# Patient Record
Sex: Male | Born: 1950 | Race: White | Hispanic: No | Marital: Married | Smoking: Never smoker
Health system: Southern US, Community
[De-identification: ages and names within clinical notes are randomized; demographics above are authoritative.]

## PROBLEM LIST (undated history)

## (undated) DIAGNOSIS — Z7689 Persons encountering health services in other specified circumstances: Secondary | ICD-10-CM

## (undated) DIAGNOSIS — Z9852 Vasectomy status: Secondary | ICD-10-CM

## (undated) DIAGNOSIS — I10 Essential (primary) hypertension: Secondary | ICD-10-CM

## (undated) DIAGNOSIS — K635 Polyp of colon: Secondary | ICD-10-CM

## (undated) DIAGNOSIS — E785 Hyperlipidemia, unspecified: Secondary | ICD-10-CM

## (undated) DIAGNOSIS — T7840XA Allergy, unspecified, initial encounter: Secondary | ICD-10-CM

## (undated) DIAGNOSIS — M549 Dorsalgia, unspecified: Secondary | ICD-10-CM

## (undated) DIAGNOSIS — I259 Chronic ischemic heart disease, unspecified: Secondary | ICD-10-CM

## (undated) DIAGNOSIS — Z9861 Coronary angioplasty status: Secondary | ICD-10-CM

## (undated) DIAGNOSIS — M6788 Other specified disorders of synovium and tendon, other site: Secondary | ICD-10-CM

## (undated) HISTORY — PX: CARDIAC CATHETERIZATION: SHX172

## (undated) HISTORY — PX: HERNIA REPAIR: SHX51

## (undated) HISTORY — PX: VASECTOMY: SHX75

## (undated) HISTORY — PX: CORONARY ANGIOPLASTY: SHX604

## (undated) HISTORY — PX: TONSILLECTOMY: SUR1361

---

## 2010-08-30 HISTORY — PX: CORONARY ARTERY BYPASS GRAFT: SHX141

## 2010-09-15 ENCOUNTER — Encounter: Payer: Self-pay | Admitting: Cardiovascular Disease

## 2013-01-18 ENCOUNTER — Emergency Department: Payer: Self-pay | Admitting: Emergency Medicine

## 2013-02-23 ENCOUNTER — Ambulatory Visit: Payer: Self-pay | Admitting: Family Medicine

## 2014-10-15 IMAGING — CT CT HEAD WITHOUT CONTRAST
1 series · 16 of 30 positions shown, 20 images · non-contrast
Comparison: none

REASON FOR EXAM: trauma to head on plavix
COMMENTS:

[Series 2: soft tissue · axial · 0.42mm/px · z∈[-152,-18]mm · 16 of 31 slices shown, 20 images]
[im 2/31  brain]
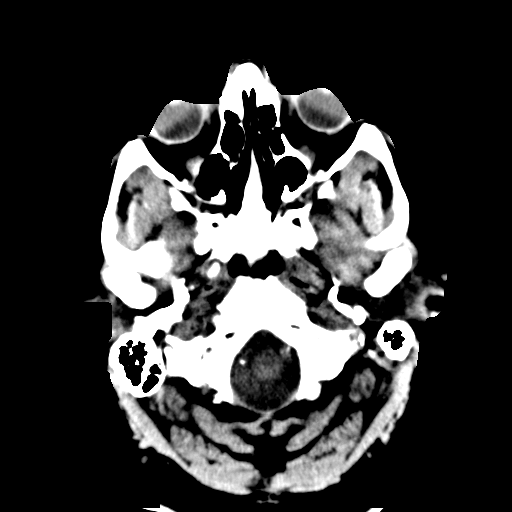
[im 2/31  bone]
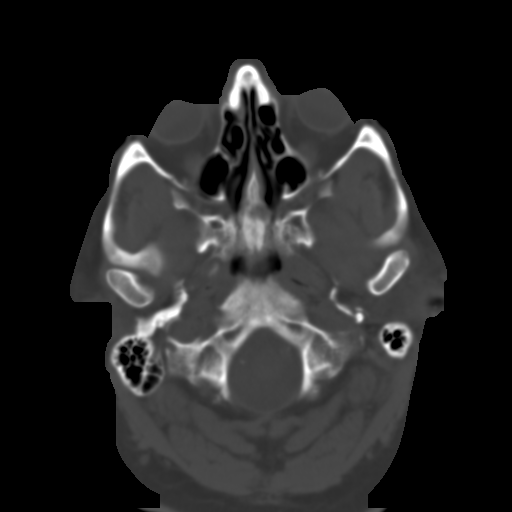
[im 4/31  brain]
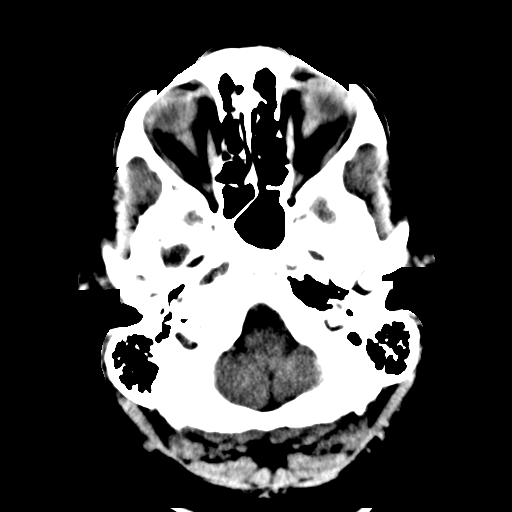
[im 6/31  brain]
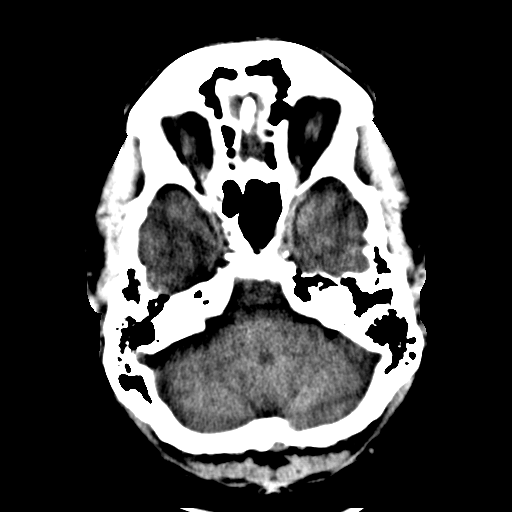
[im 8/31  brain]
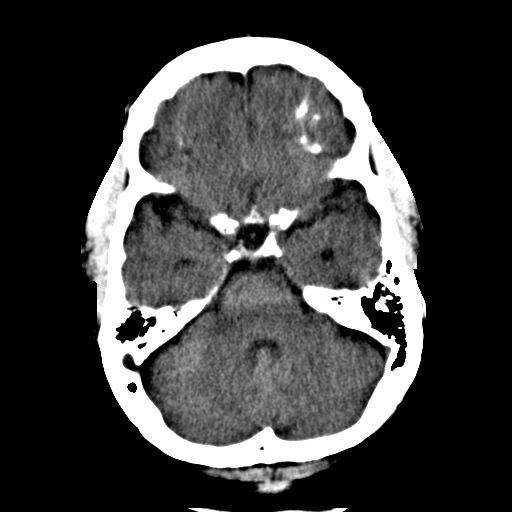
[im 9/31  brain]
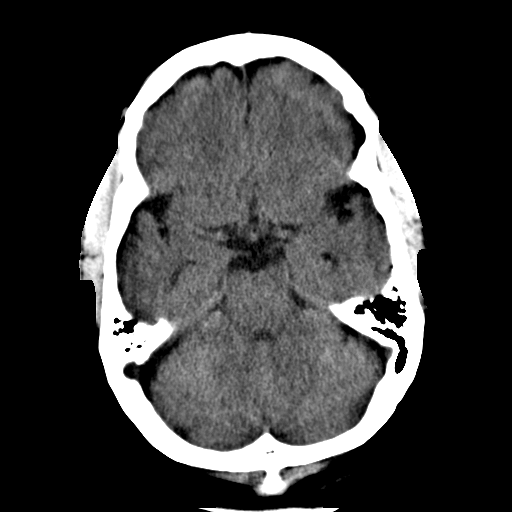
[im 9/31  bone]
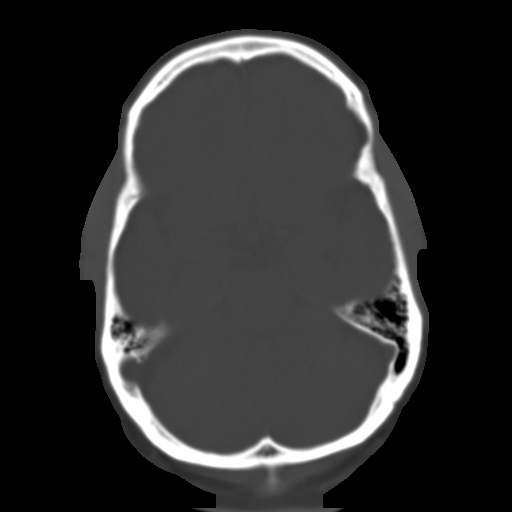
[im 11/31  brain]
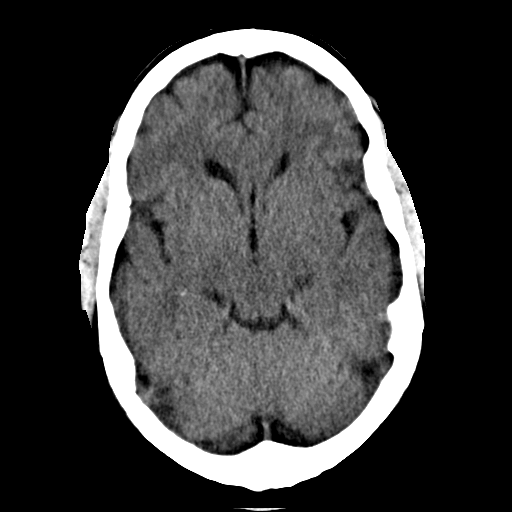
[im 13/31  brain]
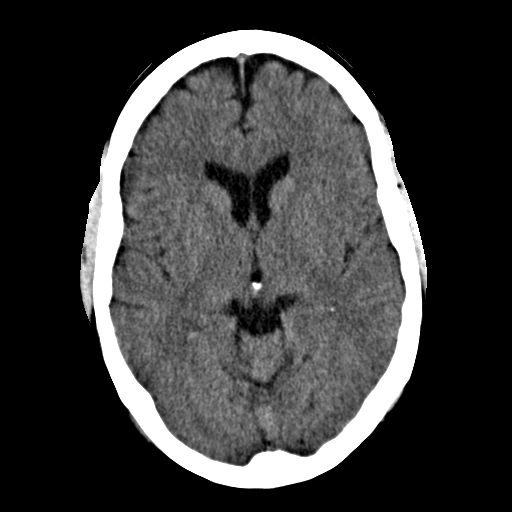
[im 15/31  brain]
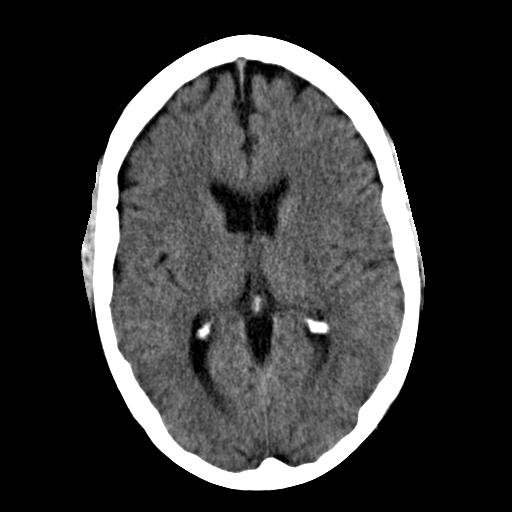
[im 16/31  brain]
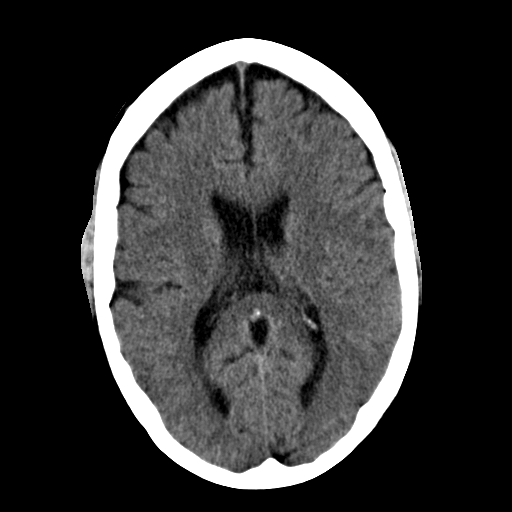
[im 16/31  bone]
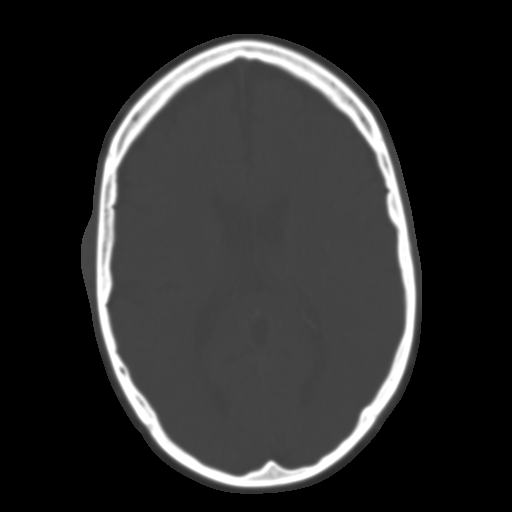
[im 18/31  brain]
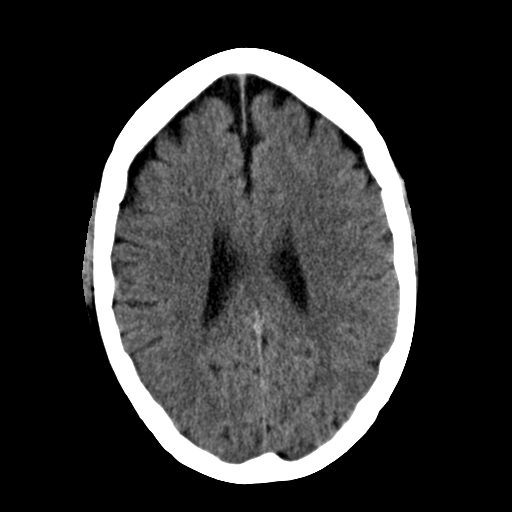
[im 20/31  brain]
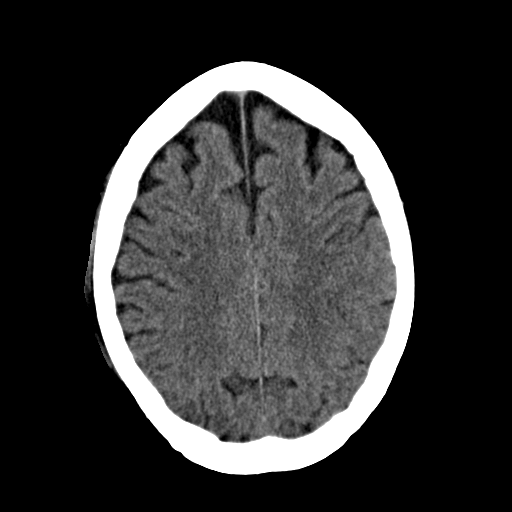
[im 22/31  brain]
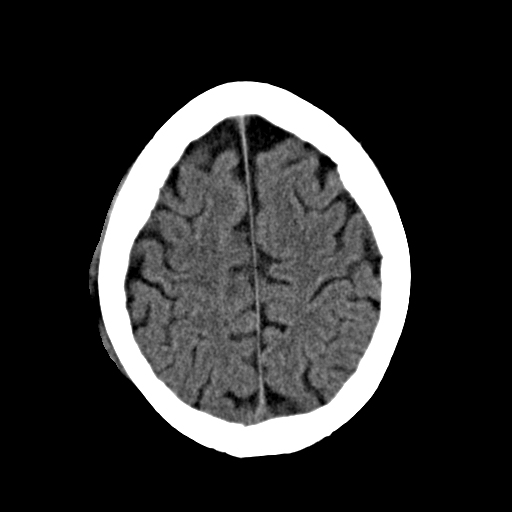
[im 23/31  brain]
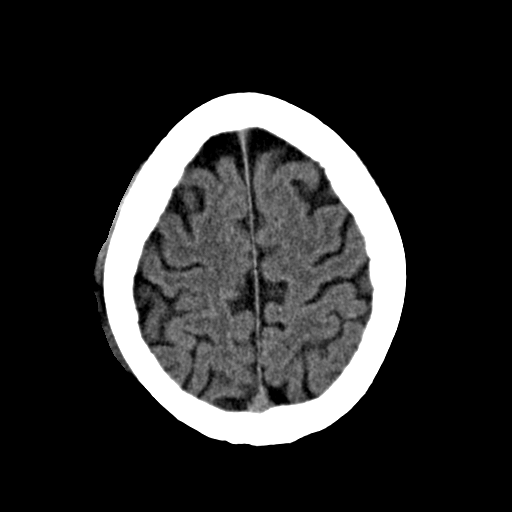
[im 23/31  bone]
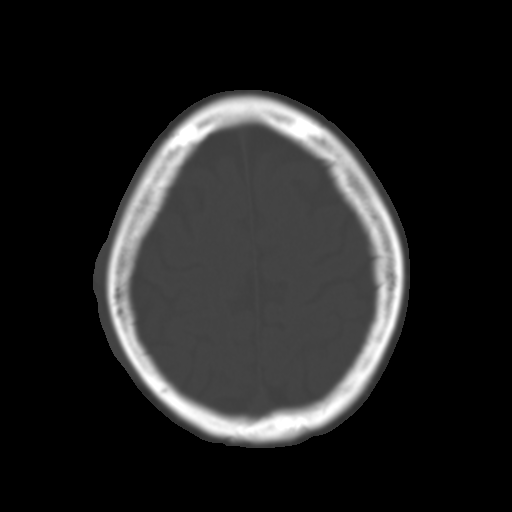
[im 25/31  brain]
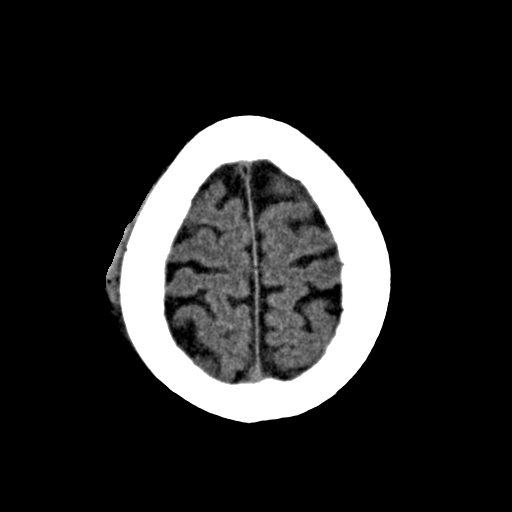
[im 27/31  brain]
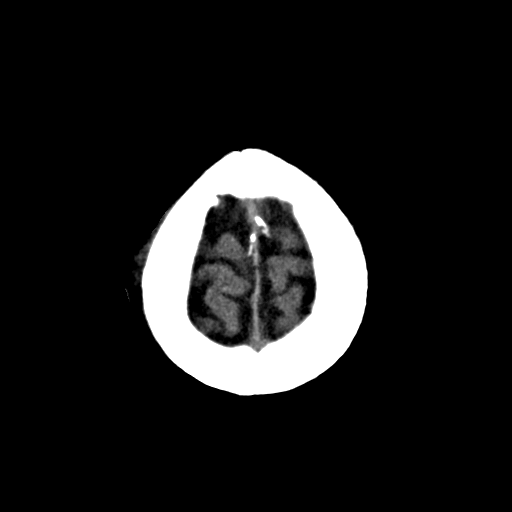
[im 29/31  brain]
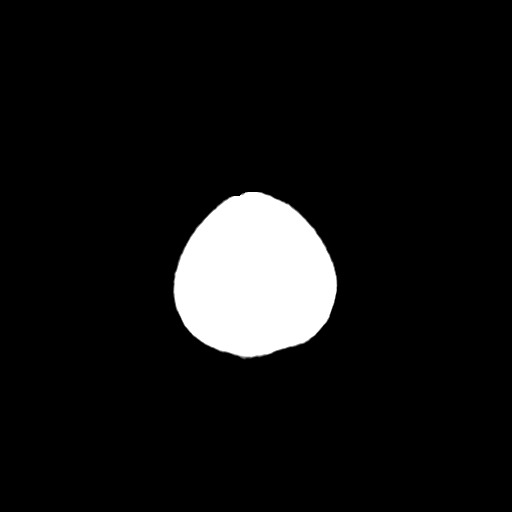

[16 of 30 positions shown; findings below may reference images not displayed]

PROCEDURE:     CT  - CT HEAD WITHOUT CONTRAST  - January 18, 2013  [DATE]

RESULT:     Noncontrast emergent CT of the brain is performed. The patient
has no previous exam for comparison.

There is mild atrophy. There is no evidence of intracranial hemorrhage, mass
effect or midline shift. No territorial infarct is evident. The orbits
appear unremarkable. The included sinuses show mucosal thickening in the
ethmoid sinuses with normal aeration otherwise in the sinuses and mastoids.
No depressed skull fracture is evident. There appears to be a Right parietal
scalp hematoma.
IMPRESSION: 1. No acute intracranial abnormality.

[REDACTED]

## 2016-11-11 ENCOUNTER — Ambulatory Visit
Admission: EM | Admit: 2016-11-11 | Discharge: 2016-11-11 | Disposition: A | Discharge status: Home / Self Care | Payer: Medicare Other | Attending: Emergency Medicine | Admitting: Emergency Medicine

## 2016-11-11 ENCOUNTER — Encounter: Payer: Self-pay | Admitting: Gynecology

## 2016-11-11 DIAGNOSIS — Z7982 Long term (current) use of aspirin: Secondary | ICD-10-CM | POA: Insufficient documentation

## 2016-11-11 DIAGNOSIS — X58XXXA Exposure to other specified factors, initial encounter: Secondary | ICD-10-CM | POA: Diagnosis not present

## 2016-11-11 DIAGNOSIS — Z79899 Other long term (current) drug therapy: Secondary | ICD-10-CM | POA: Insufficient documentation

## 2016-11-11 DIAGNOSIS — S39012A Strain of muscle, fascia and tendon of lower back, initial encounter: Secondary | ICD-10-CM | POA: Insufficient documentation

## 2016-11-11 DIAGNOSIS — R109 Unspecified abdominal pain: Secondary | ICD-10-CM | POA: Diagnosis present

## 2016-11-11 HISTORY — DX: Vasectomy status: Z98.52

## 2016-11-11 HISTORY — DX: Other specified disorders of synovium and tendon, other site: M67.88

## 2016-11-11 HISTORY — DX: Coronary angioplasty status: Z98.61

## 2016-11-11 HISTORY — DX: Essential (primary) hypertension: I10

## 2016-11-11 HISTORY — DX: Dorsalgia, unspecified: M54.9

## 2016-11-11 HISTORY — DX: Hyperlipidemia, unspecified: E78.5

## 2016-11-11 HISTORY — DX: Persons encountering health services in other specified circumstances: Z76.89

## 2016-11-11 HISTORY — DX: Allergy, unspecified, initial encounter: T78.40XA

## 2016-11-11 HISTORY — DX: Polyp of colon: K63.5

## 2016-11-11 HISTORY — DX: Chronic ischemic heart disease, unspecified: I25.9

## 2016-11-11 LAB — URINALYSIS, COMPLETE (UACMP) WITH MICROSCOPIC
Bilirubin Urine: NEGATIVE
Glucose, UA: NEGATIVE mg/dL
Hgb urine dipstick: NEGATIVE
KETONES UR: NEGATIVE mg/dL
Leukocytes, UA: NEGATIVE
Nitrite: NEGATIVE
PROTEIN: NEGATIVE mg/dL
Specific Gravity, Urine: 1.025 (ref 1.005–1.030)
pH: 6 (ref 5.0–8.0)

## 2016-11-11 MED ORDER — KETOROLAC TROMETHAMINE 60 MG/2ML IM SOLN
30.0000 mg | Freq: Once | INTRAMUSCULAR | Status: AC
  Administered 2016-11-11: 30 mg via INTRAMUSCULAR

## 2016-11-11 MED ORDER — MELOXICAM 7.5 MG PO TABS: 7.5000 mg | ORAL_TABLET | Freq: Every day | ORAL | 0 refills | Status: AC

## 2016-11-11 MED ORDER — TIZANIDINE HCL 4 MG PO CAPS: 4.0000 mg | ORAL_CAPSULE | Freq: Three times a day (TID) | ORAL | 0 refills | Status: AC

## 2016-11-11 NOTE — ED Provider Notes (Signed)
CSN: [id number]     Arrival date & time 11/11/16  1529 History   First MD Initiated Contact with Patient 11/11/16 1655     Chief Complaint  Patient presents with  . Abdominal Pain   (Consider location/radiation/quality/duration/timing/severity/associated sxs/prior Treatment) HPI  Is a 66 year old male who presents with a four-day history right lower lumbar pain that is nonradiating. Is not remember any inciting incident that has caused him to have the pain. States he's had the same problem in the past that usually was able to work it out without having any intervention. Tried. Limited amount of ibuprofen and Tylenol which have not helped at all. Exacerbated by almost any motion particularly as standing from a sitting position or from lying to sitting. Able to alleviate his pain by certain posturing when he sits or lies prone. He denies any incontinence. Some slight diarrhea which is described as soft but not watery. Is no history of kidney stones.      Past Medical History:  Diagnosis Date  . Achilles tendinosis   . Allergy   . Back pain   . Colonic polyp   . H/O coronary angioplasty   . Hx of vasectomy   . Hyperlipemia   . Hypertension   . Ischemic heart disease   . Sleep concern    Past Surgical History:  Procedure Laterality Date  . CARDIAC CATHETERIZATION    . CORONARY ANGIOPLASTY    . CORONARY ARTERY BYPASS GRAFT  08/2010  . HERNIA REPAIR    . TONSILLECTOMY    . VASECTOMY     Family History  Problem Relation Age of Onset  . CAD Mother   . CAD Father    Social History  Substance Use Topics  . Smoking status: Never Smoker  . Smokeless tobacco: Never Used  . Alcohol use Yes    Review of Systems  Constitutional: Positive for activity change. Negative for appetite change, chills, fatigue and fever.  Musculoskeletal: Positive for back pain and myalgias.  All other systems reviewed and are negative.   Allergies  Patient has no known allergies.  Home Medications    Prior to Admission medications   Medication Sig Start Date End Date Taking? Authorizing Provider  acyclovir ointment (ZOVIRAX) 5 % Apply 1 application topically every 3 (three) hours.   Yes Historical Provider, MD  Ascorbic Acid (VITAMIN C) 1000 MG tablet Take 1,000 mg by mouth daily.   Yes Historical Provider, MD  aspirin EC 81 MG tablet Take 81 mg by mouth daily.   Yes Historical Provider, MD  atorvastatin (LIPITOR) 40 MG tablet Take 40 mg by mouth daily.   Yes Historical Provider, MD  B Complex-Iron-Minerals (GERIATRIC VITAMIN PO) Take by mouth.   Yes Historical Provider, MD  calcium citrate-vitamin D (CITRACAL+D) 315-200 MG-UNIT tablet Take 1 tablet by mouth 2 (two) times daily.   Yes Historical Provider, MD  co-enzyme Q-10 50 MG capsule Take 100 mg by mouth daily.   Yes Historical Provider, MD  diclofenac sodium (VOLTAREN) 1 % GEL Apply topically 4 (four) times daily.   Yes Historical Provider, MD  escitalopram (LEXAPRO) 10 MG tablet Take 10 mg by mouth daily.   Yes Historical Provider, MD  isosorbide mononitrate (IMDUR) 30 MG 24 hr tablet Take 30 mg by mouth daily.   Yes Historical Provider, MD  Lidocaine-Hydrocortisone Ace 3-2.5 % KIT Place rectally.   Yes Historical Provider, MD  Magnesium 200 MG TABS Take by mouth.   Yes Historical Provider, MD  metoprolol  succinate (TOPROL-XL) 50 MG 24 hr tablet Take 50 mg by mouth daily. Take with or immediately following a meal.   Yes Historical Provider, MD  nitroGLYCERIN (NITROSTAT) 0.4 MG SL tablet Place 0.4 mg under the tongue every 5 (five) minutes as needed for chest pain.   Yes Historical Provider, MD  Olmesartan-Amlodipine-HCTZ (TRIBENZOR) 20-5-12.5 MG TABS Take by mouth.   Yes Historical Provider, MD  meloxicam (MOBIC) 7.5 MG tablet Take 1 tablet (7.5 mg total) by mouth daily. Take with food 11/11/16   Lorin Picket, PA-C  tiZANidine (ZANAFLEX) 4 MG capsule Take 1 capsule (4 mg total) by mouth 3 (three) times daily. 11/11/16   Lorin Picket, PA-C   Meds Ordered and Administered this Visit   Medications  ketorolac (TORADOL) injection 30 mg (30 mg Intramuscular Given 11/11/16 1724)    BP 137/76 (BP Location: Right Arm)   Pulse 64   Temp 99.1 F (37.3 C) (Oral)   Resp 16   Wt 197 lb (89.4 kg)   SpO2 98%  No data found.   Physical Exam  Constitutional: He is oriented to person, place, and time. He appears well-developed and well-nourished. No distress.  HENT:  Head: Normocephalic and atraumatic.  Eyes: EOM are normal. Pupils are equal, round, and reactive to light.  Neck: Normal range of motion. Neck supple.  Musculoskeletal: He exhibits tenderness.  Examination of lumbar spine shows a flattening of the lordotic curve. Patient outlines the area of pain from the right sacroiliac joint into the paraspinous muscles to approximately the L4 level. She has a level pelvis in stance. She has very limited forward motion has more difficulty with standing erect. He is able to lateral flex the lumbar spine to the left but to the right is very uncomfortable. He is able to toe and heel walk adequately. Sensation is intact throughout the lower extremities. DTRs are 2+ over 4 and symmetrical at the knee and ankle jerk. EHL anterior tibialis and perineal muscles are strong to clinical testing. Straight leg raise testing in the sitting position is negative at 90 on the left and positive at 80-85 on the right. Anus mostly localized over the lower lumbar area with no radicular pain reported.  Neurological: He is alert and oriented to person, place, and time. He displays normal reflexes. No sensory deficit. He exhibits normal muscle tone. Coordination normal.  Skin: Skin is warm and dry. He is not diaphoretic.  Psychiatric: He has a normal mood and affect. His behavior is normal. Judgment and thought content normal.  Nursing note and vitals reviewed.   Urgent Care Course   Clinical Course     Procedures (including critical care  time)  Labs Review Labs Reviewed  URINALYSIS, COMPLETE (UACMP) WITH MICROSCOPIC - Abnormal; Notable for the following:       Result Value   Squamous Epithelial / LPF 0-5 (*)    Bacteria, UA FEW (*)    All other components within normal limits    Imaging Review No results found.   Visual Acuity Review  Right Eye Distance:   Left Eye Distance:   Bilateral Distance:    Right Eye Near:   Left Eye Near:    Bilateral Near:     Medications  ketorolac (TORADOL) injection 30 mg (30 mg Intramuscular Given 11/11/16 1724)   Patient received minimal relief from the injection   MDM   1. Lumbar strain, initial encounter    New Prescriptions   MELOXICAM (MOBIC) 7.5 MG  TABLET    Take 1 tablet (7.5 mg total) by mouth daily. Take with food   TIZANIDINE (ZANAFLEX) 4 MG CAPSULE    Take 1 capsule (4 mg total) by mouth 3 (three) times daily.  Plan: 1. Test/x-ray results and diagnosis reviewed with patient 2. rx as per orders; risks, benefits, potential side effects reviewed with patient 3. Recommend supportive treatment with Rest and symptom avoidance. She was cautioned with regards to Zanaflex and activities requiring concentration and judgment. Follow-up with his primary care physician next week.  4. F/u prn if symptoms worsen or don't improve     Lorin Picket, PA-C 11/11/16 1808

## 2016-11-11 NOTE — ED Triage Notes (Signed)
Patient c/o RLQ pain x 4 days.

## 2023-03-16 LAB — COLOGUARD: COLOGUARD: NEGATIVE

## 2024-11-20 ENCOUNTER — Other Ambulatory Visit: Payer: Self-pay | Admitting: Neurology

## 2024-11-20 DIAGNOSIS — R413 Other amnesia: Secondary | ICD-10-CM

## 2024-12-02 ENCOUNTER — Ambulatory Visit: Admission: RE | Admit: 2024-12-02 | Discharge: 2024-12-02 | Attending: Neurology | Admitting: Neurology

## 2024-12-02 DIAGNOSIS — R413 Other amnesia: Secondary | ICD-10-CM
